# Patient Record
Sex: Male | Born: 2000 | Hispanic: No | Marital: Single
Health system: Southern US, Community
[De-identification: ages and names within clinical notes are randomized; demographics above are authoritative.]

---
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
## 2022-02-10 IMAGING — DX DG LUMBAR SPINE 2-3V
3 series · 3 of 3 positions shown · non-contrast
Comparison: None.

CLINICAL DATA: Low back pain

EXAM:
LUMBAR SPINE - 2-3 VIEW

[l-spine ap]
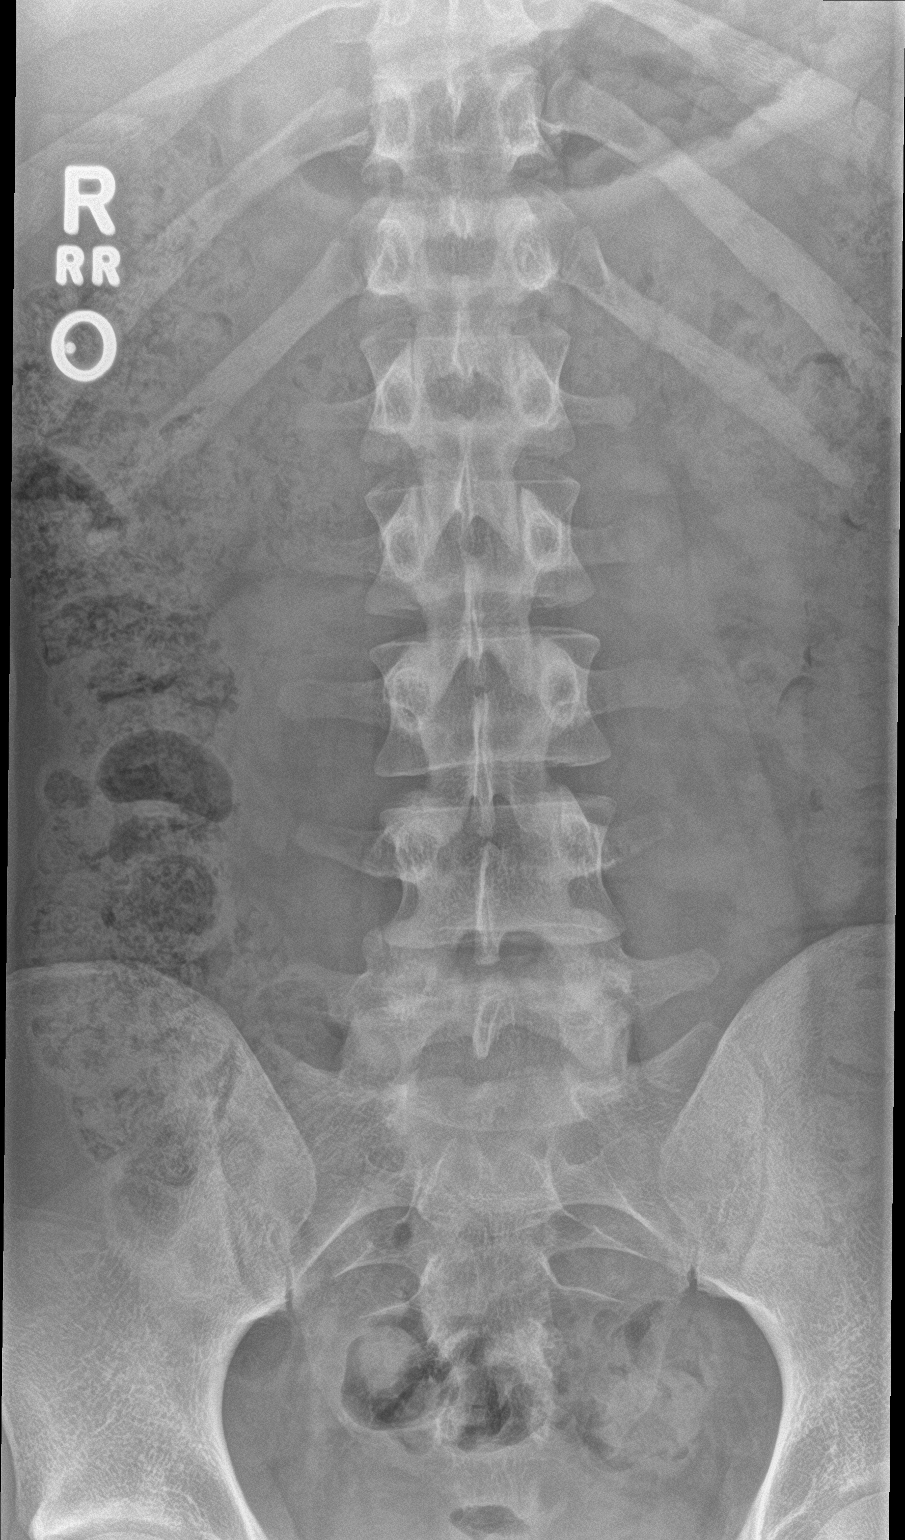

[l-spine lat]
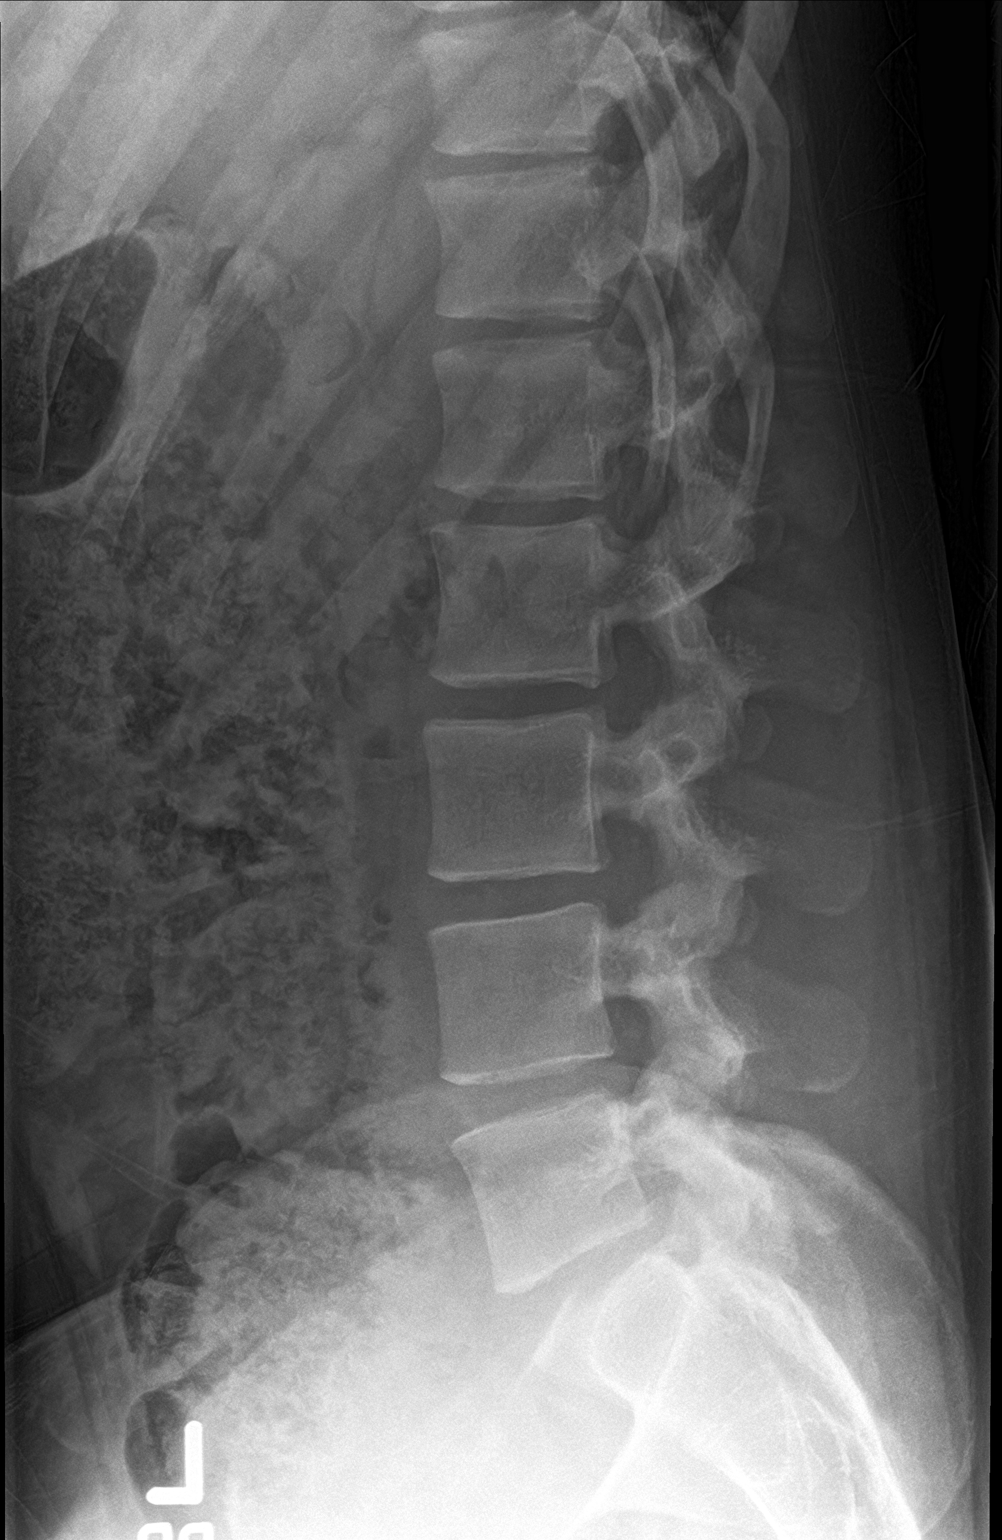

[l-spine spot]
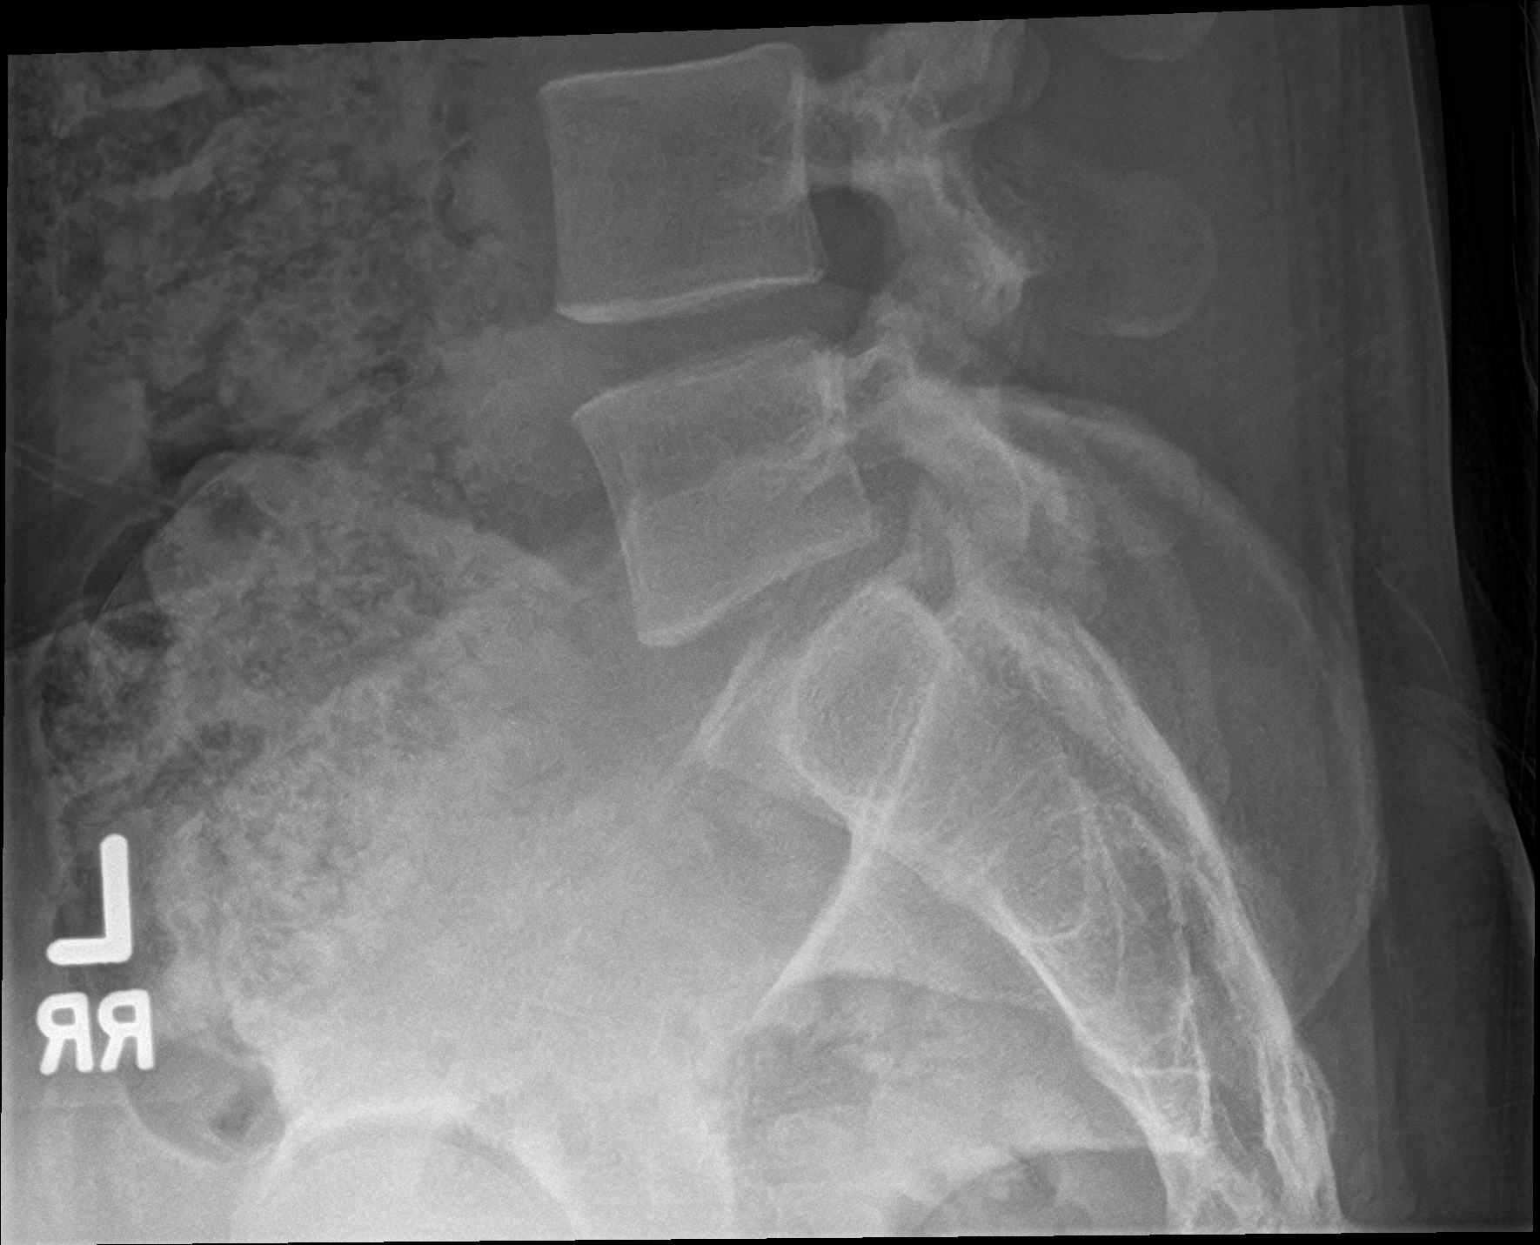

[3 of 3 positions shown; findings below may reference images not displayed]

FINDINGS: Frontal, lateral, and spot lumbosacral lateral images were obtained.
There are 5 non-rib-bearing lumbar type vertebral bodies. There is
no fracture or spondylolisthesis. Disc spaces appear unremarkable.
No erosive change.
IMPRESSION: No fracture or spondylolisthesis.  No evident arthropathy.
